# Patient Record
Sex: Female | Born: 2000 | Race: Black or African American | Hispanic: No | Marital: Single | Smoking: Never smoker
Health system: Southern US, Community
[De-identification: ages and names within clinical notes are randomized; demographics above are authoritative.]

---
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
## 2022-07-17 ENCOUNTER — Other Ambulatory Visit: Payer: Self-pay

## 2022-07-17 ENCOUNTER — Emergency Department (HOSPITAL_COMMUNITY)
Admission: EM | Admit: 2022-07-17 | Discharge: 2022-07-18 | Disposition: A | Discharge status: Home / Self Care | Payer: Medicaid Other | Attending: Emergency Medicine | Admitting: Emergency Medicine

## 2022-07-17 ENCOUNTER — Ambulatory Visit
Admission: EM | Admit: 2022-07-17 | Discharge: 2022-07-17 | Disposition: A | Discharge status: Other Acute Inpatient Hospital | Payer: Medicaid Other

## 2022-07-17 ENCOUNTER — Encounter (HOSPITAL_COMMUNITY): Payer: Self-pay | Admitting: *Deleted

## 2022-07-17 DIAGNOSIS — N939 Abnormal uterine and vaginal bleeding, unspecified: Secondary | ICD-10-CM | POA: Insufficient documentation

## 2022-07-17 LAB — I-STAT BETA HCG BLOOD, ED (MC, WL, AP ONLY): I-stat hCG, quantitative: <5 m[IU]/mL (ref <5)

## 2022-07-17 LAB — COMPREHENSIVE METABOLIC PANEL
ALT: 24 U/L (ref 0–44)
AST: 24 U/L (ref 15–41)
Albumin: 3.7 g/dL (ref 3.5–5.0)
Alkaline Phosphatase: 69 U/L (ref 38–126)
Anion gap: 7 (ref 5–15)
BUN: 10 mg/dL (ref 6–20)
CO2: 25 mmol/L (ref 22–32)
Calcium: 8.7 mg/dL — ABNORMAL LOW (ref 8.9–10.3)
Chloride: 105 mmol/L (ref 98–111)
Creatinine, Ser: 0.98 mg/dL (ref 0.44–1.00)
GFR, Estimated: >60 mL/min (ref >60)
Glucose, Bld: 91 mg/dL (ref 70–99)
Potassium: 3.6 mmol/L (ref 3.5–5.1)
Sodium: 137 mmol/L (ref 135–145)
Total Bilirubin: 0.4 mg/dL (ref 0.3–1.2)
Total Protein: 7.3 g/dL (ref 6.5–8.1)

## 2022-07-17 LAB — CBC
HCT: 33 % — ABNORMAL LOW (ref 36.0–46.0)
Hemoglobin: 10.3 g/dL — ABNORMAL LOW (ref 12.0–15.0)
MCH: 27.5 pg (ref 26.0–34.0)
MCHC: 31.2 g/dL (ref 30.0–36.0)
MCV: 88.2 fL (ref 80.0–100.0)
Platelets: 228 10*3/uL (ref 150–400)
RBC: 3.74 MIL/uL — ABNORMAL LOW (ref 3.87–5.11)
RDW: 14 % (ref 11.5–15.5)
WBC: 6.7 10*3/uL (ref 4.0–10.5)
nRBC: 0 % (ref 0.0–0.2)

## 2022-07-17 LAB — LIPASE, BLOOD: Lipase: 28 U/L (ref 11–51)

## 2022-07-17 LAB — URINALYSIS, ROUTINE W REFLEX MICROSCOPIC
Bacteria, UA: NONE SEEN
Bilirubin Urine: NEGATIVE
Glucose, UA: 150 mg/dL — AB
Ketones, ur: NEGATIVE mg/dL
Leukocytes,Ua: NEGATIVE
Nitrite: NEGATIVE
Protein, ur: 100 mg/dL — AB
RBC / HPF: >50 RBC/hpf (ref 0–5)
Specific Gravity, Urine: 1.031 — ABNORMAL HIGH (ref 1.005–1.030)
pH: 6 (ref 5.0–8.0)

## 2022-07-17 LAB — TYPE AND SCREEN
ABO/RH(D): B POS
Antibody Screen: NEGATIVE

## 2022-07-17 LAB — ABO/RH: ABO/RH(D): B POS

## 2022-07-17 NOTE — ED Provider Triage Note (Signed)
Emergency Medicine Provider Triage Evaluation Note  Ashley Kirk , a 22 y.o. female  was evaluated in triage.  Pt complains of heavy vaginal bleeding.  Patient reports that she has had vaginal bleeding for the last 9 months. Patient reports that she is seen by her PCP who prescribed her progesterone, she states that she took for 5 days and it did not alleviate her symptoms so she took for 10 days and it made her symptoms worse.  She states that she is currently going through 1 pad an hour.  She is endorsing lightheadedness, dizziness, weakness, nausea, vomiting abdominal pain.  She reports a history of the same, states that she has PCOS.  Patient reports that in the past she has had 60-month long menstrual cycle which did eventually stop. Review of Systems  Positive:  Negative:   Physical Exam  There were no vitals taken for this visit. Gen:   Awake, no distress   Resp:  Normal effort  MSK:   Moves extremities without difficulty  Other:  Generalized abdominal tenderness  Medical Decision Making  Medically screening exam initiated at 8:24 PM.  Appropriate orders placed.  Princess Bruins was informed that the remainder of the evaluation will be completed by another provider, this initial triage assessment does not replace that evaluation, and the importance of remaining in the ED until their evaluation is complete.     Al Decant, PA-C 07/17/22 2025

## 2022-07-17 NOTE — ED Triage Notes (Signed)
The pt has had  vaginal bleeding since auguest off anfd on she has been on  medicines that is not helping    vaginal bleeding at present heavier in the past 3-4 days

## 2022-07-17 NOTE — Discharge Instructions (Signed)
Due to the amount that you are bleeding it is concerning and it needs immediate evaluation especially since-year-old medicine currently that should be slowing the bleeding down  Please go to the nearest emergency department to obtain lab work to check your blood levels as well as further evaluation most likely through imaging to determine the cause

## 2022-07-17 NOTE — ED Triage Notes (Signed)
Pt states has been bleeding since 10/03/2021. States her PCP put her on a 10 day regimen of Medroxyprogesterone. States now having heavier bleeding and bad cramps. States going through 3 tampons in an hour.

## 2022-07-17 NOTE — ED Notes (Signed)
Patient is being discharged from the Urgent Care and sent to the Emergency Department via POV . Per Hansel Starling, NP, patient is in need of higher level of care due to further evaluation. Patient is aware and verbalizes understanding of plan of care.  Vitals:   07/17/22 1900  BP: 117/70  Pulse: 86  Resp: 18  Temp: 98.7 F (37.1 C)  SpO2: 99%

## 2022-07-18 MED ORDER — KETOROLAC TROMETHAMINE 15 MG/ML IJ SOLN
15.0000 mg | Freq: Once | INTRAMUSCULAR | Status: AC
  Administered 2022-07-18: 15 mg via INTRAMUSCULAR
  Filled 2022-07-18: qty 1

## 2022-07-18 NOTE — Discharge Instructions (Signed)
You can make an appointment to see a GYN provider:   [facility]  [address]  [phone]   [facility] [place]  [address]  [phone]   [facility] [place]  [address]  [phone]   [facility]  [address]  [phone]   [facility]  [address]  [phone]   If you already have an established OB/GYN provider in the area you can make an appointment with them as well.   

## 2022-07-18 NOTE — ED Provider Notes (Signed)
[place] EMERGENCY DEPARTMENT AT [facility] Provider Note   CSN: [id number] Arrival date & time: 07/17/22  1954     History  Chief Complaint  Patient presents with   Vaginal Bleeding    Ashley Kirk is a 22 y.o. female.  22 year old for now presents emergency room with concern for vaginal bleeding.  Reports that she has been on her menstrual cycle since August.  Patient has been going to her primary care provider who has provided her with oral medications which are not helping with the vaginal bleeding.  Notes that she has been bleeding heavy for the past 4 days using 1 super tampon per hour.  Came in today due to ongoing/worsening cramping.  History of PCOS.  Does not see gynecology.       Home Medications Prior to Admission medications   Medication Sig Start Date End Date Taking? Authorizing Provider  ibuprofen (ADVIL,MOTRIN) 200 MG tablet Take 200 mg by mouth every 6 (six) hours as needed for moderate pain.    [provider]      Allergies    Patient has no known allergies.    Review of Systems   Review of Systems Negative except as per HPI Physical Exam Updated Vital Signs BP (!) 118/58 (BP Location: Right Arm)   Pulse 66   Temp 98.3 F (36.8 C)   Resp 18   Ht 5\' 2"  (1.575 m)   Wt 88.5 kg   LMP 07/17/2022   SpO2 100%   BMI 35.67 kg/m  Physical Exam Vitals and nursing note reviewed.  Constitutional:      General: She is not in acute distress.    Appearance: She is well-developed. She is not diaphoretic.  HENT:     Head: Normocephalic and atraumatic.  Cardiovascular:     Rate and Rhythm: Normal rate and regular rhythm.     Heart sounds: Normal heart sounds.  Pulmonary:     Effort: Pulmonary effort is normal.     Breath sounds: Normal breath sounds.  Abdominal:     Palpations: Abdomen is soft.     Tenderness: There is no abdominal tenderness.  Skin:    General: Skin is warm and dry.     Findings: No erythema or rash.   Neurological:     Mental Status: She is alert and oriented to person, place, and time.  Psychiatric:        Behavior: Behavior normal.     ED Results / Procedures / Treatments   Labs (all labs ordered are listed, but only abnormal results are displayed) Labs Reviewed  CBC - Abnormal; Notable for the following components:      Result Value   RBC 3.74 (*)    Hemoglobin 10.3 (*)    HCT 33.0 (*)    All other components within normal limits  COMPREHENSIVE METABOLIC PANEL - Abnormal; Notable for the following components:   Calcium 8.7 (*)    All other components within normal limits  URINALYSIS, ROUTINE W REFLEX MICROSCOPIC - Abnormal; Notable for the following components:   Color, Urine AMBER (*)    APPearance CLOUDY (*)    Specific Gravity, Urine 1.031 (*)    Glucose, UA 150 (*)    Hgb urine dipstick LARGE (*)    Protein, ur 100 (*)    All other components within normal limits  LIPASE, BLOOD  I-STAT BETA HCG BLOOD, ED ([facility], [facility], [facility] ONLY)  TYPE AND SCREEN  ABO/RH    EKG  None  Radiology No results found.  Procedures Procedures    Medications Ordered in ED Medications  ketorolac (TORADOL) 15 MG/ML injection 15 mg (has no administration in time range)    ED Course/ Medical Decision Making/ A&P                             Medical Decision Making  22 year old female presents for evaluation of heavy vaginal bleeding x 4 days with report of ongoing vaginal bleeding x 9 months.  Patient has been going to her PCP who is provided oral medications which have not helped with her bleeding.  On exam, abdomen is soft nontender.  Vital signs are stable.  Hemoglobin and hematocrit are not significantly changed compared to prior on file through [facility] records which are reviewed by me.  Has had anemia workup showing slightly low ferritin, states she is taking vitamins for this.  Patient has not been to gynecology.  Plan is to provide Toradol for her cramps, referral to  community gynecology clinics for follow-up.  Offered reassurance, her H&H are unchanged compared to prior on file from March, her vitals are stable.  Encouraged follow-up with GYN.        Final Clinical Impression(s) / ED Diagnoses Final diagnoses:  Vaginal bleeding    Rx / DC Orders ED Discharge Orders     None         Jeannie Fend, PA-C 07/18/22 4098    Zadie Rhine, MD 07/18/22 [phone]

## 2022-07-18 NOTE — ED Provider Notes (Signed)
Patient presents for evaluation of vaginal bleeding since October 03, 2021.  Has been being managed by her PCP, currently taking Medrol progesterone 2-day course.  Over the last 2 days symptoms have worsened in severity with increased bleeding and lower abdominal cramping.  Endorses that today she has been going through 3 tampons an hour, each tampon heavily saturated.  History of ovarian cyst  Due to the extent of bleeding with history of anemia patient is being sent to the nearest emergency department for immediate evaluation and workup, vital signs are stable at this time to escort self.   Valinda Hoar, NP 07/18/22 1650
